# Patient Record
Sex: Female | Born: 1990 | Race: Black or African American | Hispanic: No | Marital: Single | Smoking: Never smoker
Health system: Southern US, Community
[De-identification: ages and names within clinical notes are randomized; demographics above are authoritative.]

## PROBLEM LIST (undated history)

## (undated) DIAGNOSIS — J45909 Unspecified asthma, uncomplicated: Secondary | ICD-10-CM

---

## 2015-05-22 ENCOUNTER — Emergency Department (HOSPITAL_COMMUNITY)
Admission: EM | Admit: 2015-05-22 | Discharge: 2015-05-22 | Disposition: A | Discharge status: Home / Self Care | Payer: BLUE CROSS/BLUE SHIELD | Attending: Physician Assistant | Admitting: Physician Assistant

## 2015-05-22 ENCOUNTER — Emergency Department (HOSPITAL_COMMUNITY): Payer: BLUE CROSS/BLUE SHIELD

## 2015-05-22 ENCOUNTER — Encounter (HOSPITAL_COMMUNITY): Payer: Self-pay | Admitting: Nurse Practitioner

## 2015-05-22 DIAGNOSIS — R0602 Shortness of breath: Secondary | ICD-10-CM | POA: Diagnosis present

## 2015-05-22 DIAGNOSIS — Z3202 Encounter for pregnancy test, result negative: Secondary | ICD-10-CM | POA: Diagnosis not present

## 2015-05-22 DIAGNOSIS — J159 Unspecified bacterial pneumonia: Secondary | ICD-10-CM | POA: Diagnosis not present

## 2015-05-22 DIAGNOSIS — J189 Pneumonia, unspecified organism: Secondary | ICD-10-CM

## 2015-05-22 LAB — CBC
HCT: 36.4 % (ref 36.0–46.0)
Hemoglobin: 12.3 g/dL (ref 12.0–15.0)
MCH: 27.9 pg (ref 26.0–34.0)
MCHC: 33.8 g/dL (ref 30.0–36.0)
MCV: 82.5 fL (ref 78.0–100.0)
PLATELETS: 259 10*3/uL (ref 150–400)
RBC: 4.41 MIL/uL (ref 3.87–5.11)
RDW: 15.3 % (ref 11.5–15.5)
WBC: 6.9 10*3/uL (ref 4.0–10.5)

## 2015-05-22 LAB — COMPREHENSIVE METABOLIC PANEL
ALK PHOS: 54 U/L (ref 38–126)
ALT: 16 U/L (ref 14–54)
AST: 16 U/L (ref 15–41)
Albumin: 3.9 g/dL (ref 3.5–5.0)
Anion gap: 12 (ref 5–15)
BUN: 7 mg/dL (ref 6–20)
CHLORIDE: 104 mmol/L (ref 101–111)
CO2: 24 mmol/L (ref 22–32)
CREATININE: 0.64 mg/dL (ref 0.44–1.00)
Calcium: 9.8 mg/dL (ref 8.9–10.3)
GFR calc Af Amer: >60 mL/min (ref >60)
Glucose, Bld: 123 mg/dL — ABNORMAL HIGH (ref 65–99)
Potassium: 3.7 mmol/L (ref 3.5–5.1)
Sodium: 140 mmol/L (ref 135–145)
Total Bilirubin: <0.1 mg/dL — ABNORMAL LOW (ref 0.3–1.2)
Total Protein: 7 g/dL (ref 6.5–8.1)

## 2015-05-22 LAB — URINALYSIS, ROUTINE W REFLEX MICROSCOPIC
BILIRUBIN URINE: NEGATIVE
GLUCOSE, UA: NEGATIVE mg/dL
KETONES UR: NEGATIVE mg/dL
Nitrite: NEGATIVE
PROTEIN: NEGATIVE mg/dL
Specific Gravity, Urine: 1.02 (ref 1.005–1.030)
pH: 7 (ref 5.0–8.0)

## 2015-05-22 LAB — I-STAT BETA HCG BLOOD, ED (MC, WL, AP ONLY): I-stat hCG, quantitative: <5 m[IU]/mL (ref <5)

## 2015-05-22 LAB — URINE MICROSCOPIC-ADD ON: WBC, UA: NONE SEEN WBC/hpf (ref 0–5)

## 2015-05-22 LAB — I-STAT CG4 LACTIC ACID, ED: Lactic Acid, Venous: 1.3 mmol/L (ref 0.5–2.0)

## 2015-05-22 MED ORDER — GUAIFENESIN-CODEINE 100-10 MG/5ML PO SOLN: 5.0000 mL | Freq: Four times a day (QID) | ORAL | Status: DC | PRN

## 2015-05-22 MED ORDER — BENZONATATE 100 MG PO CAPS: 100.0000 mg | ORAL_CAPSULE | Freq: Three times a day (TID) | ORAL | Status: DC | PRN

## 2015-05-22 MED ORDER — AZITHROMYCIN 250 MG PO TABS
500.0000 mg | ORAL_TABLET | Freq: Once | ORAL | Status: AC
  Administered 2015-05-22: 500 mg via ORAL
  Filled 2015-05-22: qty 2

## 2015-05-22 MED ORDER — AZITHROMYCIN 250 MG PO TABS: 250.0000 mg | ORAL_TABLET | Freq: Once | ORAL | Status: DC

## 2015-05-22 NOTE — ED Provider Notes (Signed)
CSN: [id number]     Arrival date & time 05/22/15  1748 History   First MD Initiated Contact with Patient 05/22/15 2209     Chief Complaint  Patient presents with  . 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 Patient has had a couple days of feeling fatigued, mild fevers, cough, congestion.  Patient says that she's had multiple students present with similar complaints and be diagnosed with flu.  Patient able to take by mouth without issue.  Reports cough during the day  helped by cough syrup.   History reviewed. No pertinent past medical history. History reviewed. No pertinent past surgical history. History reviewed. No pertinent family history. Social History  Substance Use Topics  . Smoking status: Never Smoker   . Smokeless tobacco: None  . Alcohol Use: Yes   OB History    No data available     Review of Systems  Constitutional: Negative for activity change.  HENT: Negative for congestion.   Respiratory: Positive for shortness of breath.   Cardiovascular: Negative for chest pain.  Gastrointestinal: Negative for abdominal pain.  Genitourinary: Negative for dysuria.  Neurological: Negative for dizziness.  Psychiatric/Behavioral: Negative for confusion.      Allergies  Ortho tri-cyclen  Home Medications   Prior to Admission medications   Not on File   BP 127/88 mmHg  Pulse 76  Temp(Src) 98.2 F (36.8 C) (Oral)  Resp 18  SpO2 100%  LMP 05/15/2015 Physical Exam  Constitutional: She is oriented to person, place, and time. She appears well-developed and well-nourished.  HENT:  Head: Normocephalic and atraumatic.  Eyes: Conjunctivae are normal. Right eye exhibits no discharge.  Neck: Neck supple.  Cardiovascular: Normal rate, regular rhythm and normal heart sounds.   No murmur heard. Pulmonary/Chest:  Effort normal and breath sounds normal. She has no wheezes. She has no rales.  Abdominal: Soft. She exhibits no distension. There is no tenderness.  Musculoskeletal: Normal range of motion. She exhibits no edema.  Neurological: She is oriented to person, place, and time. No cranial nerve deficit.  Skin: Skin is warm and dry. No rash noted. She is not diaphoretic.  Psychiatric: She has a normal mood and affect. Her behavior is normal.  Nursing note and vitals reviewed.   ED Course  Procedures (including critical care time) Labs Review Labs Reviewed  COMPREHENSIVE METABOLIC PANEL - Abnormal; Notable for the following:    Glucose, Bld 123 (*)    Total Bilirubin <0.1 (*)    All other components within normal limits  URINALYSIS, ROUTINE W REFLEX MICROSCOPIC (NOT AT [facility]) - Abnormal; Notable for the following:    APPearance TURBID (*)    Hgb urine dipstick SMALL (*)    Leukocytes, UA TRACE (*)    All other components within normal limits  URINE MICROSCOPIC-ADD ON - Abnormal; Notable for the following:    Squamous Epithelial / LPF 0-5 (*)    Bacteria, UA RARE (*)    All other components within normal limits  CBC  I-STAT BETA HCG BLOOD, ED ([facility], [facility], [facility] ONLY)  I-STAT CG4 LACTIC ACID, ED    Imaging Review Dg Chest 2 View  05/22/2015  CLINICAL DATA:  Worsening productive cough over the last 4 days. EXAM: CHEST  2 VIEW COMPARISON:  None. FINDINGS: Heart size is normal. Mediastinal shadows are normal. The right lung is clear. There is patchy  infiltrate at the left base medially. No effusions. No bony abnormalities. IMPRESSION: Small area of pneumonia left lower lobe posterior medially. Electronically Signed   By: Paulina Fusi M.D.   On: 05/22/2015 19:23   I have personally reviewed and evaluated these images and lab results as part of my medical decision-making.   EKG Interpretation None      MDM   Final diagnoses:  None    Patient is a 25 year old female presenting with cough  congestion and flulike symptoms for the last couple days. Patient has multiple sick contacts at school. Chest x-ray shows pneumonia. We'll treat for community acquired pneumonia. Patient has no wheezing on exam. We'll give her azithromycin as well as Tessalon Perles and cough syrup with codeine for evenings. Patient taking by mouth, normal physical exam and normal vital signs at time of discharge.   Liah Morr Randall An, MD 05/22/15 [phone]

## 2015-05-22 NOTE — Discharge Instructions (Signed)
Please return as needed with any concerns.  Community-Acquired Pneumonia, Adult Pneumonia is an infection of the lungs. One type of pneumonia can happen while a person is in a hospital. A different type can happen when a person is not in a hospital (community-acquired pneumonia). It is easy for this kind to spread from person to person. It can spread to you if you breathe near an infected person who coughs or sneezes. Some symptoms include:  A dry cough.  A wet (productive) cough.  Fever.  Sweating.  Chest pain. HOME CARE  Take over-the-counter and prescription medicines only as told by your doctor.  Only take cough medicine if you are losing sleep.  If you were prescribed an antibiotic medicine, take it as told by your doctor. Do not stop taking the antibiotic even if you start to feel better.  Sleep with your head and neck raised (elevated). You can do this by putting a few pillows under your head, or you can sleep in a recliner.  Do not use tobacco products. These include cigarettes, chewing tobacco, and e-cigarettes. If you need help quitting, ask your doctor.  Drink enough water to keep your pee (urine) clear or pale yellow. A shot (vaccine) can help prevent pneumonia. Shots are often suggested for:  People older than 26 years of age.  People older than 26 years of age:  Who are having cancer treatment.  Who have long-term (chronic) lung disease.  Who have problems with their body's defense system (immune system). You may also prevent pneumonia if you take these actions:  Get the flu (influenza) shot every year.  Go to the dentist as often as told.  Wash your hands often. If soap and water are not available, use hand sanitizer. GET HELP IF:  You have a fever.  You lose sleep because your cough medicine does not help. GET HELP RIGHT AWAY IF:  You are short of breath and it gets worse.  You have more chest pain.  Your sickness gets worse. This is very  serious if:  You are an older adult.  Your body's defense system is weak.  You cough up blood.   This information is not intended to replace advice given to you by your health care provider. Make sure you discuss any questions you have with your health care provider.   Document Released: 09/05/2007 Document Revised: 12/08/2014 Document Reviewed: 07/14/2014 Elsevier Interactive Patient Education [employer]

## 2015-05-22 NOTE — ED Notes (Signed)
She c/o 3 day history of cough, body aches, nausea, diarrhea. She has been around multiple people with flu symptoms and is concerned for the flu. She is A&Ox4, resp e/u

## 2017-04-14 ENCOUNTER — Ambulatory Visit (INDEPENDENT_AMBULATORY_CARE_PROVIDER_SITE_OTHER): Payer: BC Managed Care – PPO

## 2017-04-14 ENCOUNTER — Other Ambulatory Visit: Payer: Self-pay

## 2017-04-14 ENCOUNTER — Ambulatory Visit (HOSPITAL_COMMUNITY)
Admission: EM | Admit: 2017-04-14 | Discharge: 2017-04-14 | Disposition: A | Discharge status: Home / Self Care | Payer: BC Managed Care – PPO | Attending: Internal Medicine | Admitting: Internal Medicine

## 2017-04-14 ENCOUNTER — Encounter (HOSPITAL_COMMUNITY): Payer: Self-pay | Admitting: *Deleted

## 2017-04-14 DIAGNOSIS — K5900 Constipation, unspecified: Secondary | ICD-10-CM

## 2017-04-14 DIAGNOSIS — R110 Nausea: Secondary | ICD-10-CM

## 2017-04-14 DIAGNOSIS — Z3202 Encounter for pregnancy test, result negative: Secondary | ICD-10-CM | POA: Diagnosis not present

## 2017-04-14 LAB — POCT PREGNANCY, URINE: PREG TEST UR: NEGATIVE

## 2017-04-14 LAB — POCT URINALYSIS DIP (DEVICE)
Bilirubin Urine: NEGATIVE
Glucose, UA: NEGATIVE mg/dL
KETONES UR: 15 mg/dL — AB
Leukocytes, UA: NEGATIVE
Nitrite: NEGATIVE
Protein, ur: NEGATIVE mg/dL
Specific Gravity, Urine: 1.025 (ref 1.005–1.030)
Urobilinogen, UA: 0.2 mg/dL (ref 0.0–1.0)
pH: 6.5 (ref 5.0–8.0)

## 2017-04-14 MED ORDER — MINERAL OIL RE ENEM: 1.0000 | ENEMA | Freq: Once | RECTAL | 0 refills | Status: AC

## 2017-04-14 MED ORDER — POLYETHYLENE GLYCOL 3350 17 GM/SCOOP PO POWD: 17.0000 g | Freq: Every day | ORAL | 0 refills | Status: DC

## 2017-04-14 NOTE — Discharge Instructions (Addendum)
Urine test was notable only for blood today, consistent with your period just ending.  Pregnancy test was negative.  X-ray showed moderate stool.  Prescriptions for MiraLAX and for oil enema were sent to the pharmacy.  Use the oil enema today and try to retain for a couple of hours, to lubricate stool.  Put a spoon full of MiraLAX in a glass of liquid and drink 2 or 3 times today, and then adjust dose as needed to achieve one soft stool daily, until you have been feeling better for a week or so.  Eating an apple every day may be very helpful.  Taking fiber is also helpful, either may increase bloating and gassiness temporarily.  Recheck or follow-up with your primary care provider if severe/sustained (more than an hour) abdominal pain or vomiting occur, or if bowel movements are not improving over the next few days.

## 2017-04-14 NOTE — ED Triage Notes (Signed)
C/O constipation x 4 days.  Last BM 4 days ago and was normal.  States she strained 2 days ago and had few small pellets.  No c/o full rectum, back pain, nausea.  Having difficulty sitting due to pain.  Has taken one dose of stool softener (no laxatives).

## 2017-04-14 NOTE — ED Provider Notes (Signed)
[facility]    CSN: [id number] Arrival date & time: 04/14/17  1302     History   Chief Complaint Chief Complaint  Patient presents with  . Constipation  . Nausea    HPI Sandra Esparza is a 27 y.o. female.   She presents today with 5 days difficulty having a bowel movement.  She has been having to strain, usually has 1-2 bowel movements a day, now just pellets and a lot of spasm/cramping.  No dysuria, no urinary frequency, but has been avoiding urinating, because it sets off the bowel spasms.  Just had a normal menstrual period, except may be a little less cramping than usual.  No unusual vaginal discharge or bleeding at present.  Uses condoms to prevent pregnancy.  Little bit of nausea, poor appetite, but not vomiting.  No fever.    HPI  History reviewed. No pertinent past medical history.  History reviewed. No pertinent surgical history.   Home Medications    Prior to Admission medications   Medication Sig Start Date End Date Taking? Authorizing Provider  polyethylene glycol powder (GLYCOLAX/MIRALAX) powder Take 17 g by mouth daily for 15 doses. Put in beverage and drink.  Adjust dose up or down to achieve 1 soft stool daily. 04/14/17 04/29/17  Isa RankinMurray, Laura Wilson, MD    Family History No family history on file.  Social History Social History   Tobacco Use  . Smoking status: Never Smoker  . Smokeless tobacco: Never Used  Substance Use Topics  . Alcohol use: Yes    Comment: occasionally  . Drug use: No     Allergies   Ortho tri-cyclen [norgestimate-eth estradiol] and Latex   Review of Systems Review of Systems  All other systems reviewed and are negative.    Physical Exam Triage Vital Signs ED Triage Vitals  Enc Vitals Group     BP 04/14/17 1332 135/83     Pulse Rate 04/14/17 1332 83     Resp 04/14/17 1332 16     Temp 04/14/17 1332 98.9 F (37.2 C)     Temp Source 04/14/17 1332 Oral     SpO2 04/14/17 1332 97 %     Weight --    Height --      Pain Score 04/14/17 1333 8     Pain Loc --    Updated Vital Signs BP 135/83   Pulse 83   Temp 98.9 F (37.2 C) (Oral)   Resp 16   LMP 04/09/2017 (Exact Date)   SpO2 97%  Physical Exam  Constitutional: She is oriented to person, place, and time. No distress.  HENT:  Head: Atraumatic.  Eyes:  Conjugate gaze observed, no eye redness/discharge  Neck: Neck supple.  Cardiovascular: Normal rate and regular rhythm.  Pulmonary/Chest: No respiratory distress. She has no wheezes. She has no rales.  Lungs clear, symmetric breath sounds Diminished in both bases  Abdominal: Soft. There is no tenderness. There is no rebound and no guarding.  Little bit distended, but still soft Some increase in abdominal discomfort if laying on her left side  Musculoskeletal: Normal range of motion.  Neurological: She is alert and oriented to person, place, and time.  Skin: Skin is warm and dry.  Nursing note and vitals reviewed.    UC Treatments / Results  Labs Results for orders placed or performed during the hospital encounter of 04/14/17  POCT urinalysis dip (device)  Result Value Ref Range   Glucose, UA NEGATIVE NEGATIVE mg/dL  Bilirubin Urine NEGATIVE NEGATIVE   Ketones, ur 15 (A) NEGATIVE mg/dL   Specific Gravity, Urine 1.025 1.005 - 1.030   Hgb urine dipstick MODERATE (A) NEGATIVE   pH 6.5 5.0 - 8.0   Protein, ur NEGATIVE NEGATIVE mg/dL   Urobilinogen, UA 0.2 0.0 - 1.0 mg/dL   Nitrite NEGATIVE NEGATIVE   Leukocytes, UA NEGATIVE NEGATIVE  Pregnancy, urine POC  Result Value Ref Range   Preg Test, Ur NEGATIVE NEGATIVE    Radiology Dg Abd Acute W/chest  Result Date: 04/14/2017 CLINICAL DATA:  Constipation, abdominal pain EXAM: DG ABDOMEN ACUTE W/ 1V CHEST COMPARISON:  05/22/2015 FINDINGS: Moderate stool burden throughout the colon. The bowel gas pattern is normal. There is no evidence of free intraperitoneal air. No suspicious radio-opaque calculi or other significant  radiographic abnormality is seen. Heart size and mediastinal contours are within normal limits. Both lungs are clear. IMPRESSION: Moderate stool burden.  No acute findings. Electronically Signed   By: Charlett Nose M.D.   On: 04/14/2017 14:39    Procedures Procedures (including critical care time) None today  Final Clinical Impressions(s) / UC Diagnoses   Final diagnoses:  Constipation, unspecified constipation type   Urine test was notable only for blood today, consistent with your period just ending.  Pregnancy test was negative.  X-ray showed moderate stool.  Prescriptions for MiraLAX and for oil enema were sent to the pharmacy.  Use the oil enema today and try to retain for a couple of hours, to lubricate stool.  Put a spoon full of MiraLAX in a glass of liquid and drink 2 or 3 times today, and then adjust dose as needed to achieve one soft stool daily, until you have been feeling better for a week or so.  Eating an apple every day may be very helpful.  Taking fiber is also helpful, either may increase bloating and gassiness temporarily.  Recheck or follow-up with your primary care provider if severe/sustained (more than an hour) abdominal pain or vomiting occur, or if bowel movements are not improving over the next few days.  ED Discharge Orders        Ordered    polyethylene glycol powder (GLYCOLAX/MIRALAX) powder  Daily     04/14/17 1451    mineral oil enema   Once     04/14/17 1451          Isa Rankin, MD 04/15/17 1004

## 2017-04-29 ENCOUNTER — Ambulatory Visit (INDEPENDENT_AMBULATORY_CARE_PROVIDER_SITE_OTHER): Payer: BC Managed Care – PPO

## 2017-04-29 ENCOUNTER — Encounter (HOSPITAL_COMMUNITY): Payer: Self-pay | Admitting: Emergency Medicine

## 2017-04-29 ENCOUNTER — Ambulatory Visit (HOSPITAL_COMMUNITY)
Admission: EM | Admit: 2017-04-29 | Discharge: 2017-04-29 | Disposition: A | Discharge status: Home / Self Care | Payer: BC Managed Care – PPO | Attending: Family Medicine | Admitting: Family Medicine

## 2017-04-29 DIAGNOSIS — B349 Viral infection, unspecified: Secondary | ICD-10-CM | POA: Diagnosis not present

## 2017-04-29 DIAGNOSIS — R05 Cough: Secondary | ICD-10-CM

## 2017-04-29 DIAGNOSIS — R0789 Other chest pain: Secondary | ICD-10-CM | POA: Diagnosis not present

## 2017-04-29 MED ORDER — CETIRIZINE-PSEUDOEPHEDRINE ER 5-120 MG PO TB12: 1.0000 | ORAL_TABLET | Freq: Every day | ORAL | 0 refills | Status: DC

## 2017-04-29 MED ORDER — BENZONATATE 100 MG PO CAPS: 100.0000 mg | ORAL_CAPSULE | Freq: Three times a day (TID) | ORAL | 0 refills | Status: DC

## 2017-04-29 MED ORDER — IPRATROPIUM BROMIDE 0.06 % NA SOLN: 2.0000 | Freq: Four times a day (QID) | NASAL | 0 refills | Status: DC

## 2017-04-29 NOTE — ED Triage Notes (Signed)
PT reports sore throat last Wednesday. This resolved.  PT reports congestion, chest pain, and cough that started Saturday.

## 2017-04-29 NOTE — ED Provider Notes (Signed)
[facility]    CSN: [id number] Arrival date & time: 04/29/17  1251     History   Chief Complaint Chief Complaint  Patient presents with  . URI    HPI Ina KickBarbara Brophy is a 27 y.o. female.   27 year old female comes in for 3-day history of URI symptoms.  She has had productive cough, rhinorrhea, nasal congestion, chest pain with breathing/coughing. otc cough drops, robitussin, flonase with some relief. Denies fevers, chills, night sweats. Denies shortness of breath, wheezing. Never smoker.   Patient states she has history of pneumonia with only symptoms being chest pain with coughing, and requesting chest x-ray.      History reviewed. No pertinent past medical history.  There are no active problems to display for this patient.   History reviewed. No pertinent surgical history.  OB History    No data available       Home Medications    Prior to Admission medications   Medication Sig Start Date End Date Taking? Authorizing Provider  Multiple Vitamin (MULTIVITAMIN WITH MINERALS) TABS tablet Take 1 tablet by mouth daily.   Yes [provider]  benzonatate (TESSALON) 100 MG capsule Take 1 capsule (100 mg total) by mouth every 8 (eight) hours. 04/29/17   Cathie HoopsYu, Rosaline Ezekiel V, PA-C  cetirizine-pseudoephedrine (ZYRTEC-D) 5-120 MG tablet Take 1 tablet by mouth daily. 04/29/17   Cathie HoopsYu, Molly Maselli V, PA-C  ipratropium (ATROVENT) 0.06 % nasal spray Place 2 sprays into both nostrils 4 (four) times daily. 04/29/17   Belinda FisherYu, Nikeisha Klutz V, PA-C    Family History No family history on file.  Social History Social History   Tobacco Use  . Smoking status: Never Smoker  . Smokeless tobacco: Never Used  Substance Use Topics  . Alcohol use: Yes    Comment: occasionally  . Drug use: No     Allergies   Ortho tri-cyclen [norgestimate-eth estradiol] and Latex   Review of Systems Review of Systems  Reason unable to perform ROS: See HPI as above.     Physical Exam Triage Vital Signs ED  Triage Vitals  Enc Vitals Group     BP 04/29/17 1348 115/81     Pulse Rate 04/29/17 1348 65     Resp 04/29/17 1348 16     Temp 04/29/17 1348 98.4 F (36.9 C)     Temp Source 04/29/17 1348 Oral     SpO2 04/29/17 1348 100 %     Weight 04/29/17 1349 197 lb (89.4 kg)     Height 04/29/17 1349 5\' 2"  (1.575 m)     Head Circumference --      Peak Flow --      Pain Score 04/29/17 1349 5     Pain Loc --      Pain Edu? --      Excl. in GC? --    No data found.  Updated Vital Signs BP 115/81   Pulse 65   Temp 98.4 F (36.9 C) (Oral)   Resp 16   Ht 5\' 2"  (1.575 m)   Wt 197 lb (89.4 kg)   LMP 04/09/2017 (Exact Date)   SpO2 100%   BMI 36.03 kg/m   Physical Exam  Constitutional: She is oriented to person, place, and time. She appears well-developed and well-nourished. No distress.  HENT:  Head: Normocephalic and atraumatic.  Right Ear: Tympanic membrane, external ear and ear canal normal. Tympanic membrane is not erythematous and not bulging.  Left Ear: External ear and ear canal normal.  Tympanic membrane is erythematous. Tympanic membrane is not bulging.  Nose: Mucosal edema and rhinorrhea present. Right sinus exhibits no maxillary sinus tenderness and no frontal sinus tenderness. Left sinus exhibits no maxillary sinus tenderness and no frontal sinus tenderness.  Mouth/Throat: Uvula is midline and mucous membranes are normal. Posterior oropharyngeal erythema present. Tonsils are 1+ on the right. Tonsils are 1+ on the left. No tonsillar exudate.  Eyes: Conjunctivae are normal. Pupils are equal, round, and reactive to light.  Neck: Normal range of motion. Neck supple.  Cardiovascular: Normal rate, regular rhythm and normal heart sounds. Exam reveals no gallop and no friction rub.  No murmur heard. Pulmonary/Chest: Effort normal and breath sounds normal. She has no decreased breath sounds. She has no wheezes. She has no rhonchi. She has no rales.  Lymphadenopathy:    She has no cervical  adenopathy.  Neurological: She is alert and oriented to person, place, and time.  Skin: Skin is warm and dry.  Psychiatric: She has a normal mood and affect. Her behavior is normal. Judgment normal.    UC Treatments / Results  Labs (all labs ordered are listed, but only abnormal results are displayed) Labs Reviewed - No data to display  EKG  EKG Interpretation None       Radiology Dg Chest 2 View  Result Date: 04/29/2017 CLINICAL DATA:  Congestion, chest pain central sternal area (described as sharp when breathing and coughing), and prod cough that started Saturday. Pt sts that she has a hx of asymptomatic pneumonia. Denies fever. No known heart or lung conditio.*comment was truncated* EXAM: CHEST  2 VIEW COMPARISON:  1139 FINDINGS: 18 Normal mediastinum and cardiac silhouette. Normal pulmonary vasculature. No evidence of effusion, infiltrate, or pneumothorax. No acute bony abnormality. IMPRESSION: Normal chest radiograph Electronically Signed   By: Genevive Bi M.D.   On: 04/29/2017 14:19    Procedures Procedures (including critical care time)  Medications Ordered in UC Medications - No data to display   Initial Impression / Assessment and Plan / UC Course  I have reviewed the triage vital signs and the nursing notes.  Pertinent labs & imaging results that were available during my care of the patient were reviewed by me and considered in my medical decision making (see chart for details).    Patient with lungs clear to auscultation bilaterally without adventitious lung sounds.  She is afebrile, O2 saturation at 100%.  Discussed with patient symptoms most likely due to viral illness.  Patient stating she has history of pneumonia without fever, and requesting chest x-ray.  Discussed risks and benefits, patient would like to proceed.  Will obtain chest x-ray.  Chest x-ray negative for pneumonia.  Symptomatic treatment discussed.  Return precautions given.  Final Clinical  Impressions(s) / UC Diagnoses   Final diagnoses:  Viral syndrome    ED Discharge Orders        Ordered    benzonatate (TESSALON) 100 MG capsule  Every 8 hours     04/29/17 1430    ipratropium (ATROVENT) 0.06 % nasal spray  4 times daily     04/29/17 1430    cetirizine-pseudoephedrine (ZYRTEC-D) 5-120 MG tablet  Daily     04/29/17 1430        Belinda Fisher, PA-C 04/29/17 1438

## 2017-04-29 NOTE — Discharge Instructions (Signed)
Chest x-ray negative for pneumonia. Tessalon for cough. Start atrovent nasal spray, zyrtec-D for nasal congestion. Continue flonase. You can use over the counter nasal saline rinse such as neti pot for nasal congestion. Keep hydrated, your urine should be clear to pale yellow in color. Tylenol/motrin for fever and pain. Monitor for any worsening of symptoms, chest pain, shortness of breath, wheezing, swelling of the throat, follow up for reevaluation.

## 2017-12-12 IMAGING — DX DG CHEST 2V
2 series · 2 of 2 positions shown · non-contrast
Comparison: None.

CLINICAL DATA: Worsening productive cough over the last 4 days.

EXAM:
CHEST  2 VIEW

[chest pa]
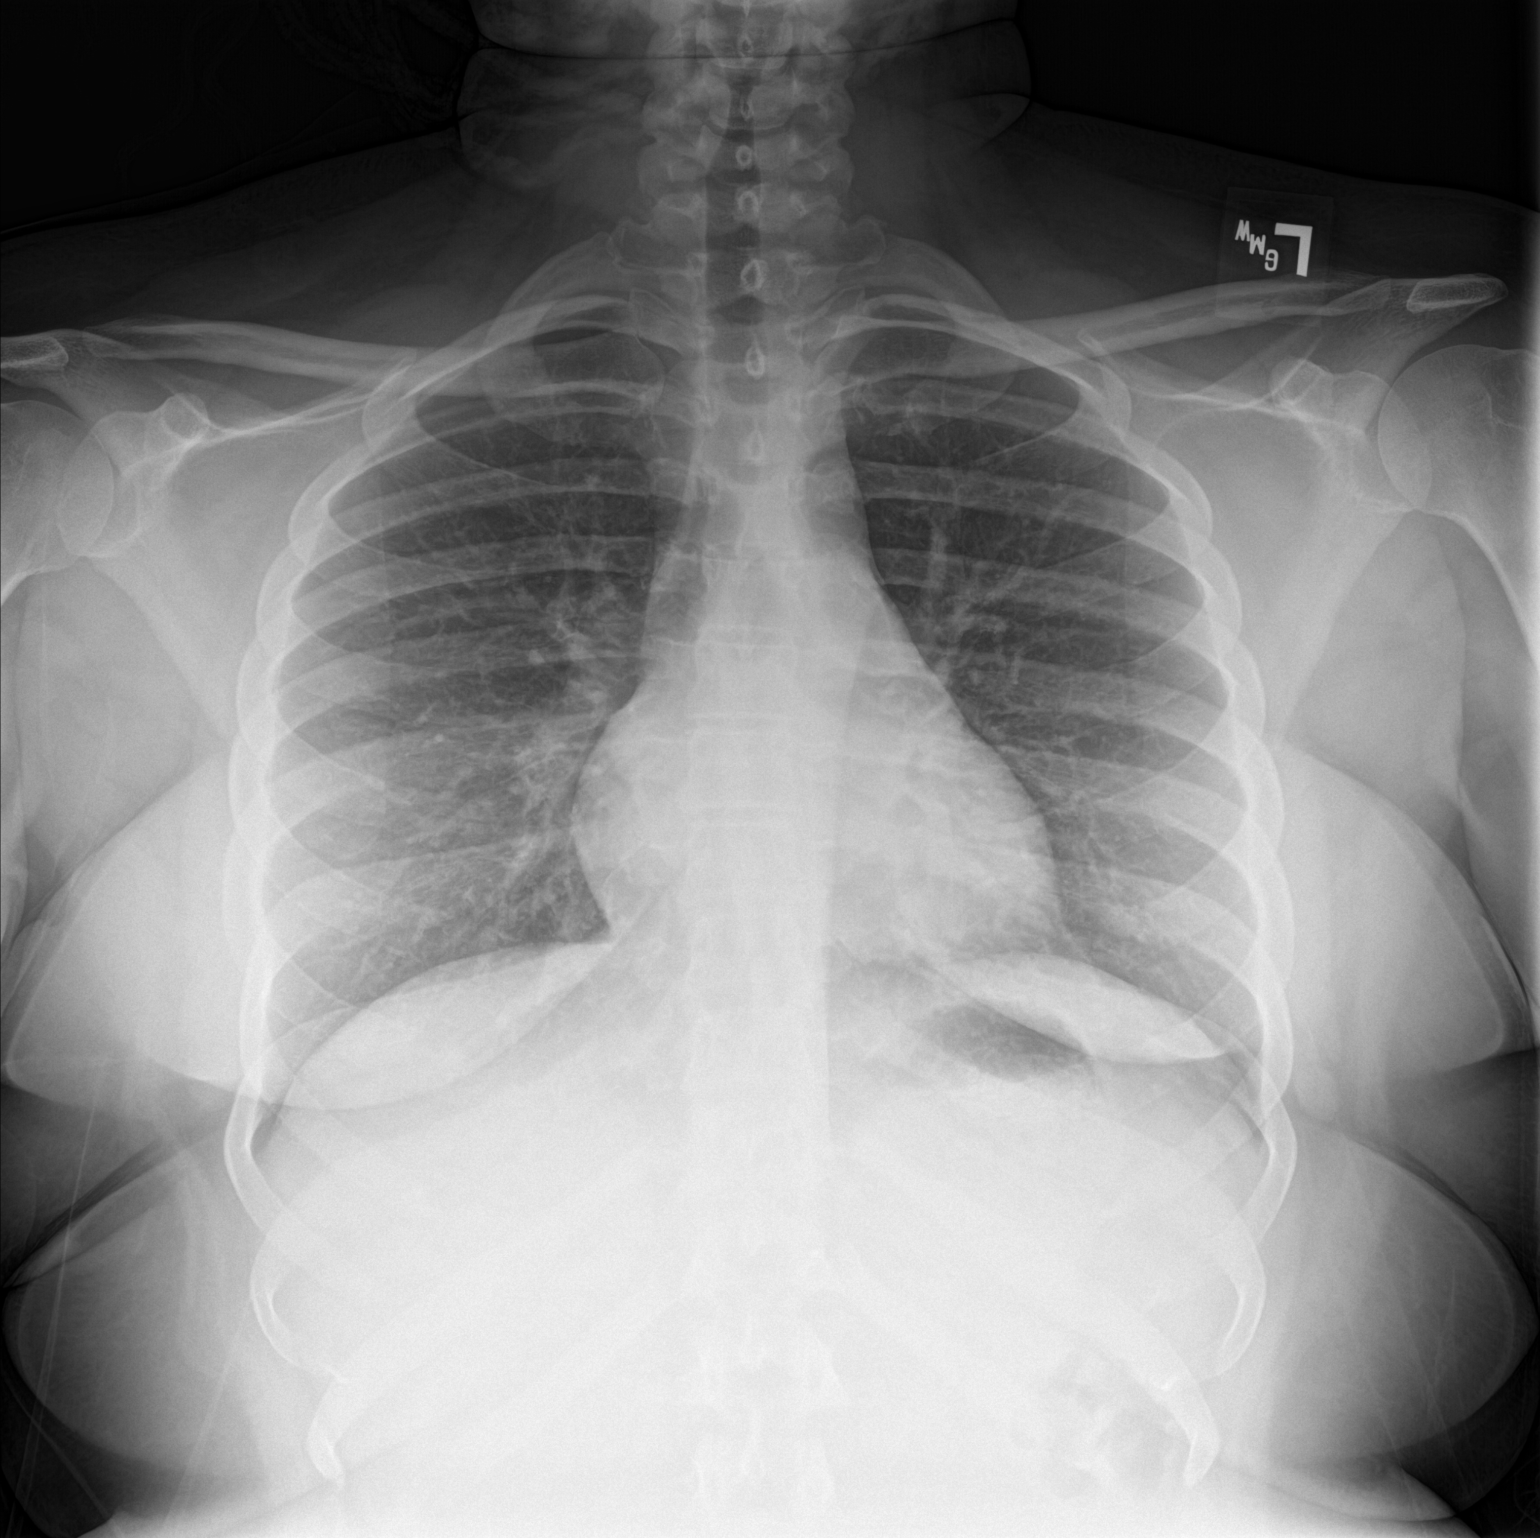

[chest lat]
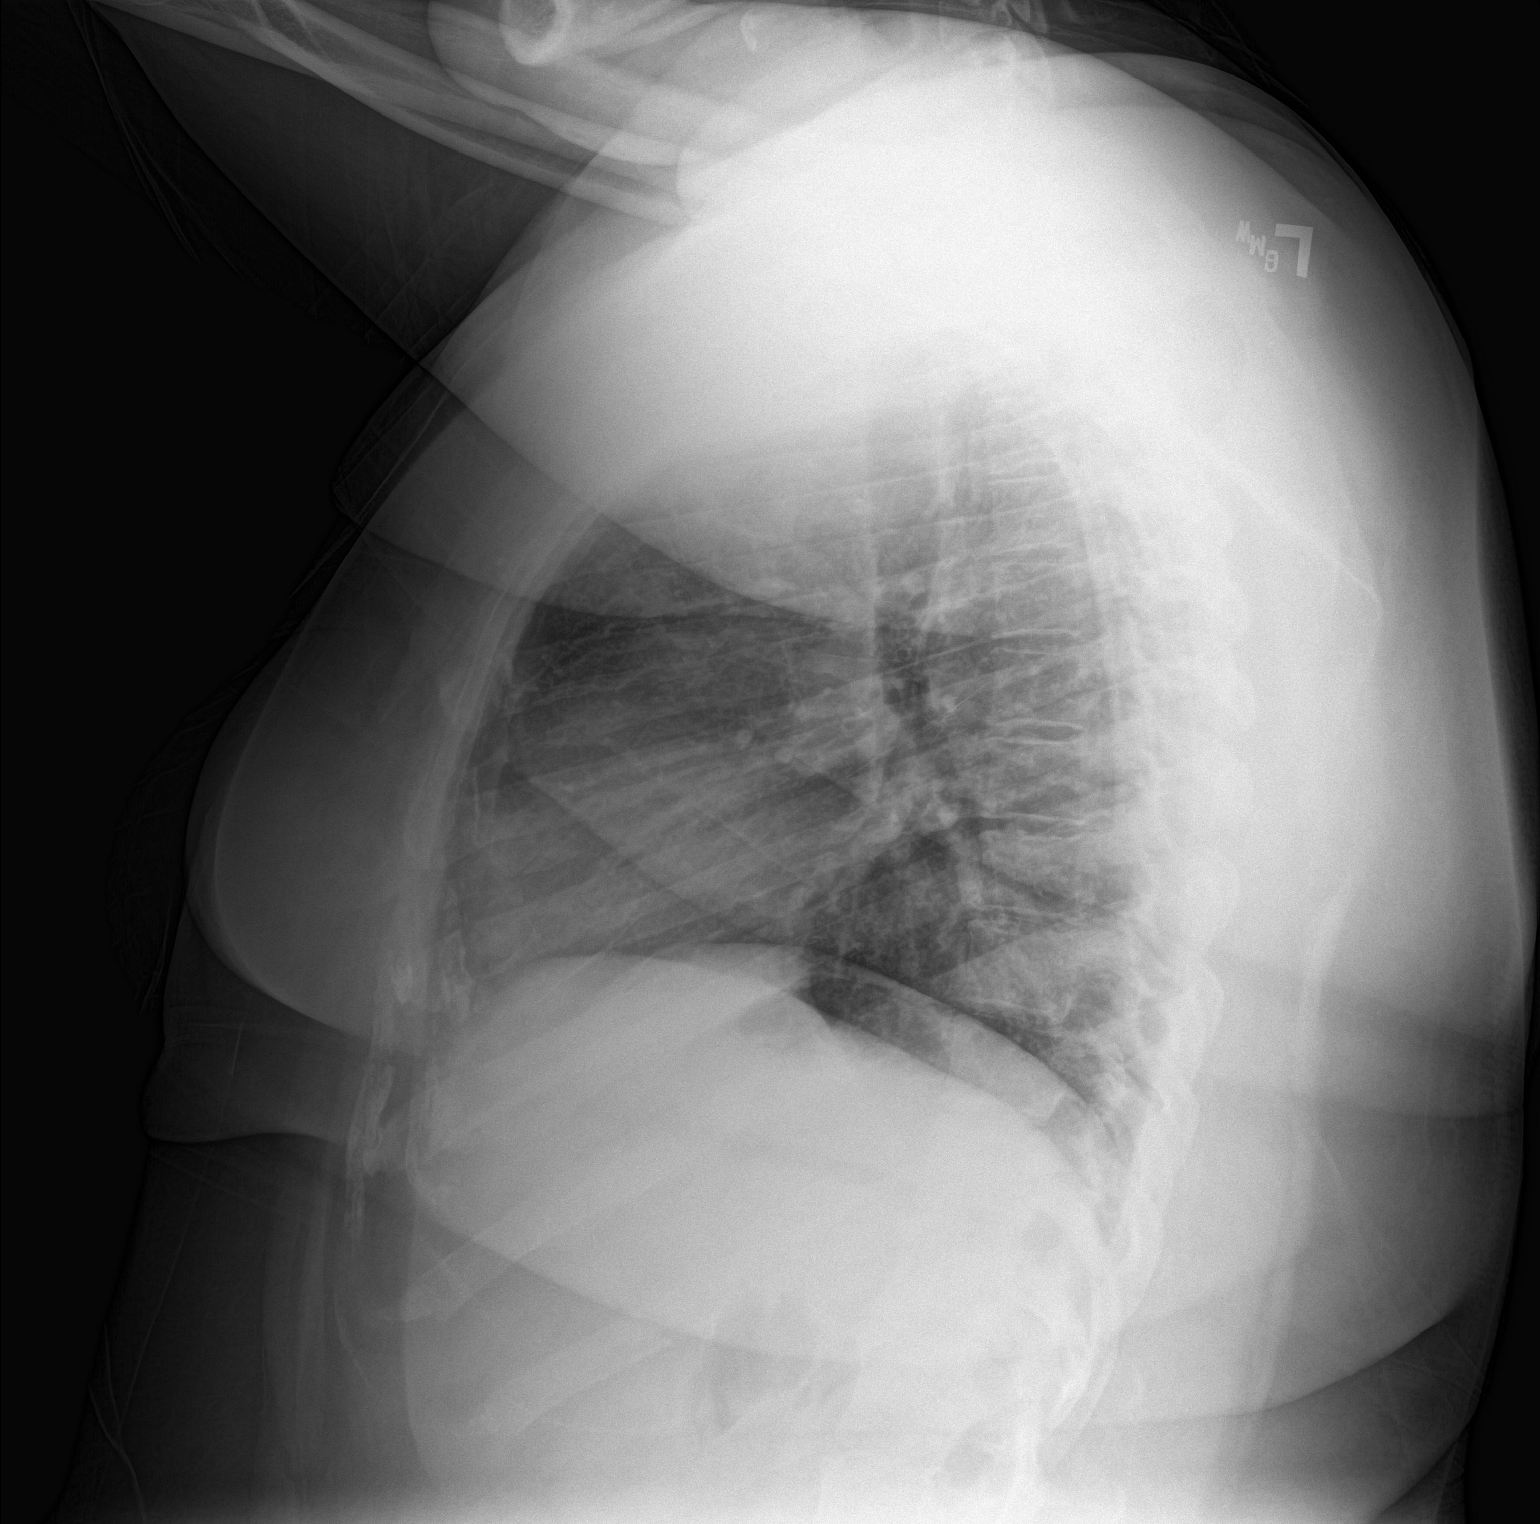

[2 of 2 positions shown; findings below may reference images not displayed]

FINDINGS: Heart size is normal. Mediastinal shadows are normal. The right lung
is clear. There is patchy infiltrate at the left base medially. No
effusions. No bony abnormalities.
IMPRESSION: Small area of pneumonia left lower lobe posterior medially.

## 2019-11-05 IMAGING — DX DG ABDOMEN ACUTE W/ 1V CHEST
3 series · 3 of 3 positions shown · non-contrast
Comparison: 05/22/2015

CLINICAL DATA: Constipation, abdominal pain

EXAM:
DG ABDOMEN ACUTE W/ 1V CHEST

[chest pa]
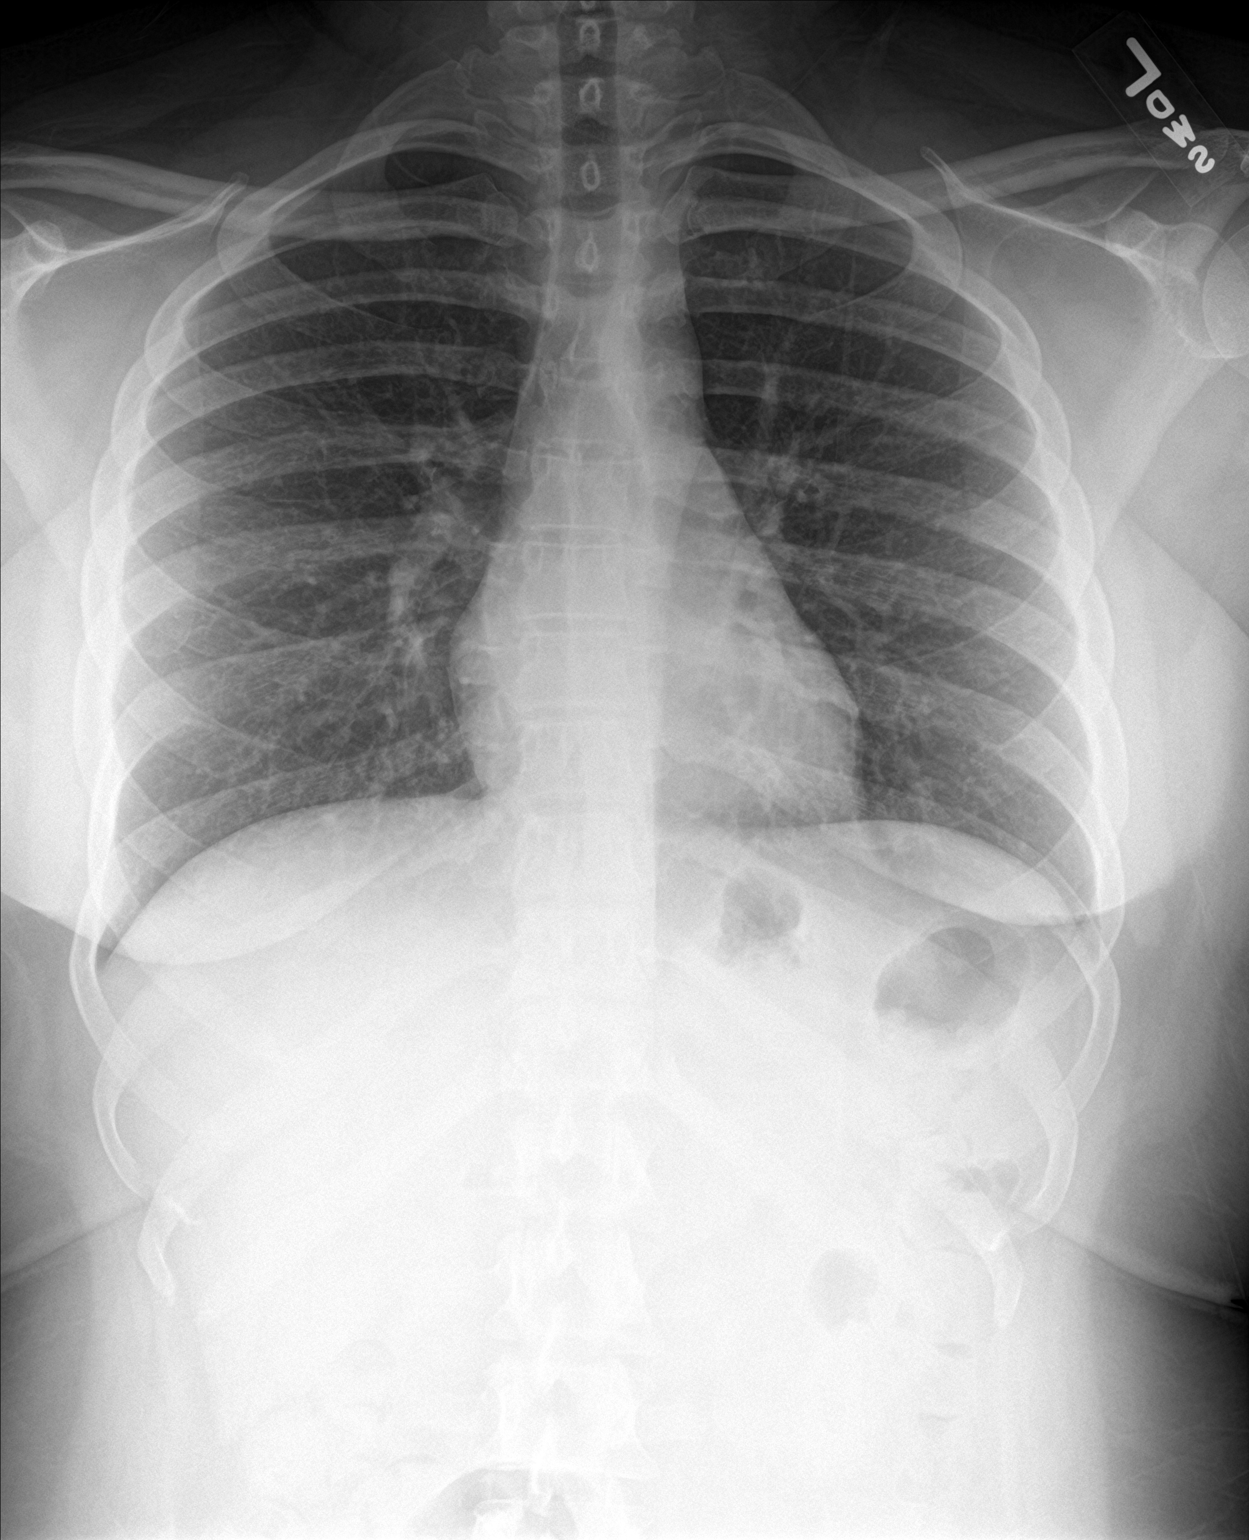

[abdomen erect]
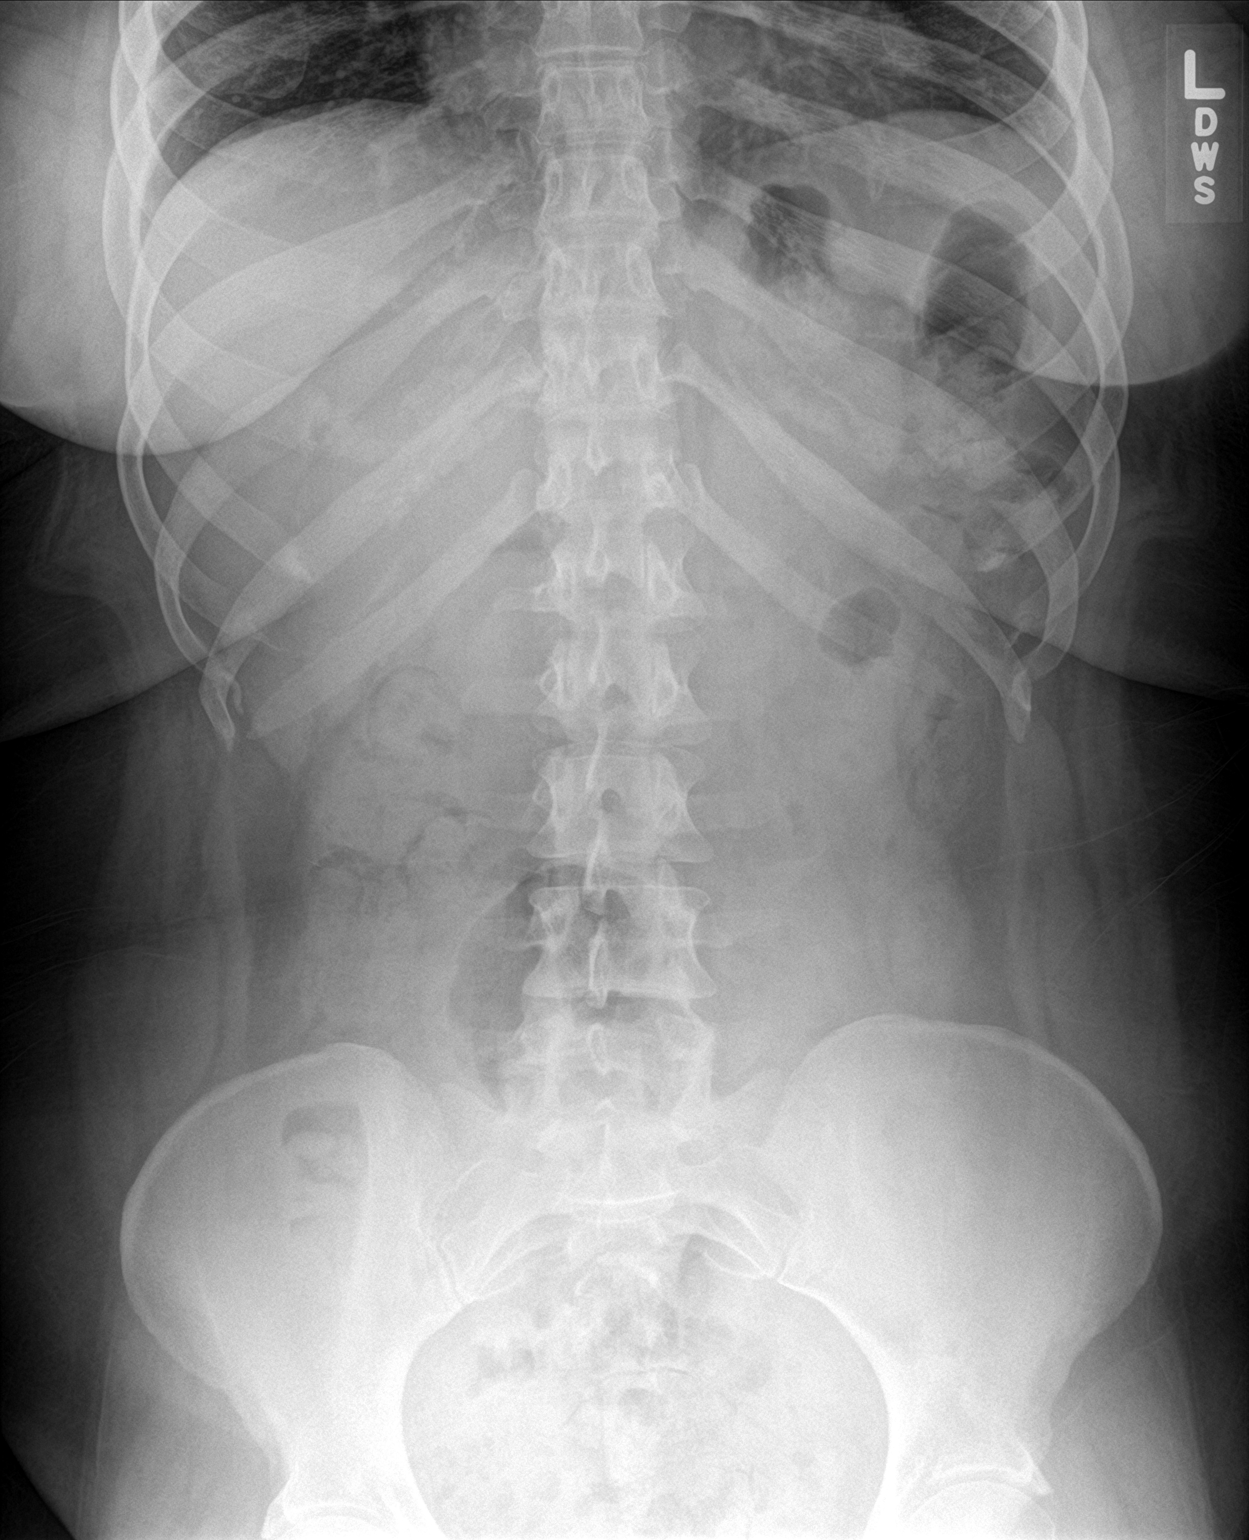

[abdomen supine]
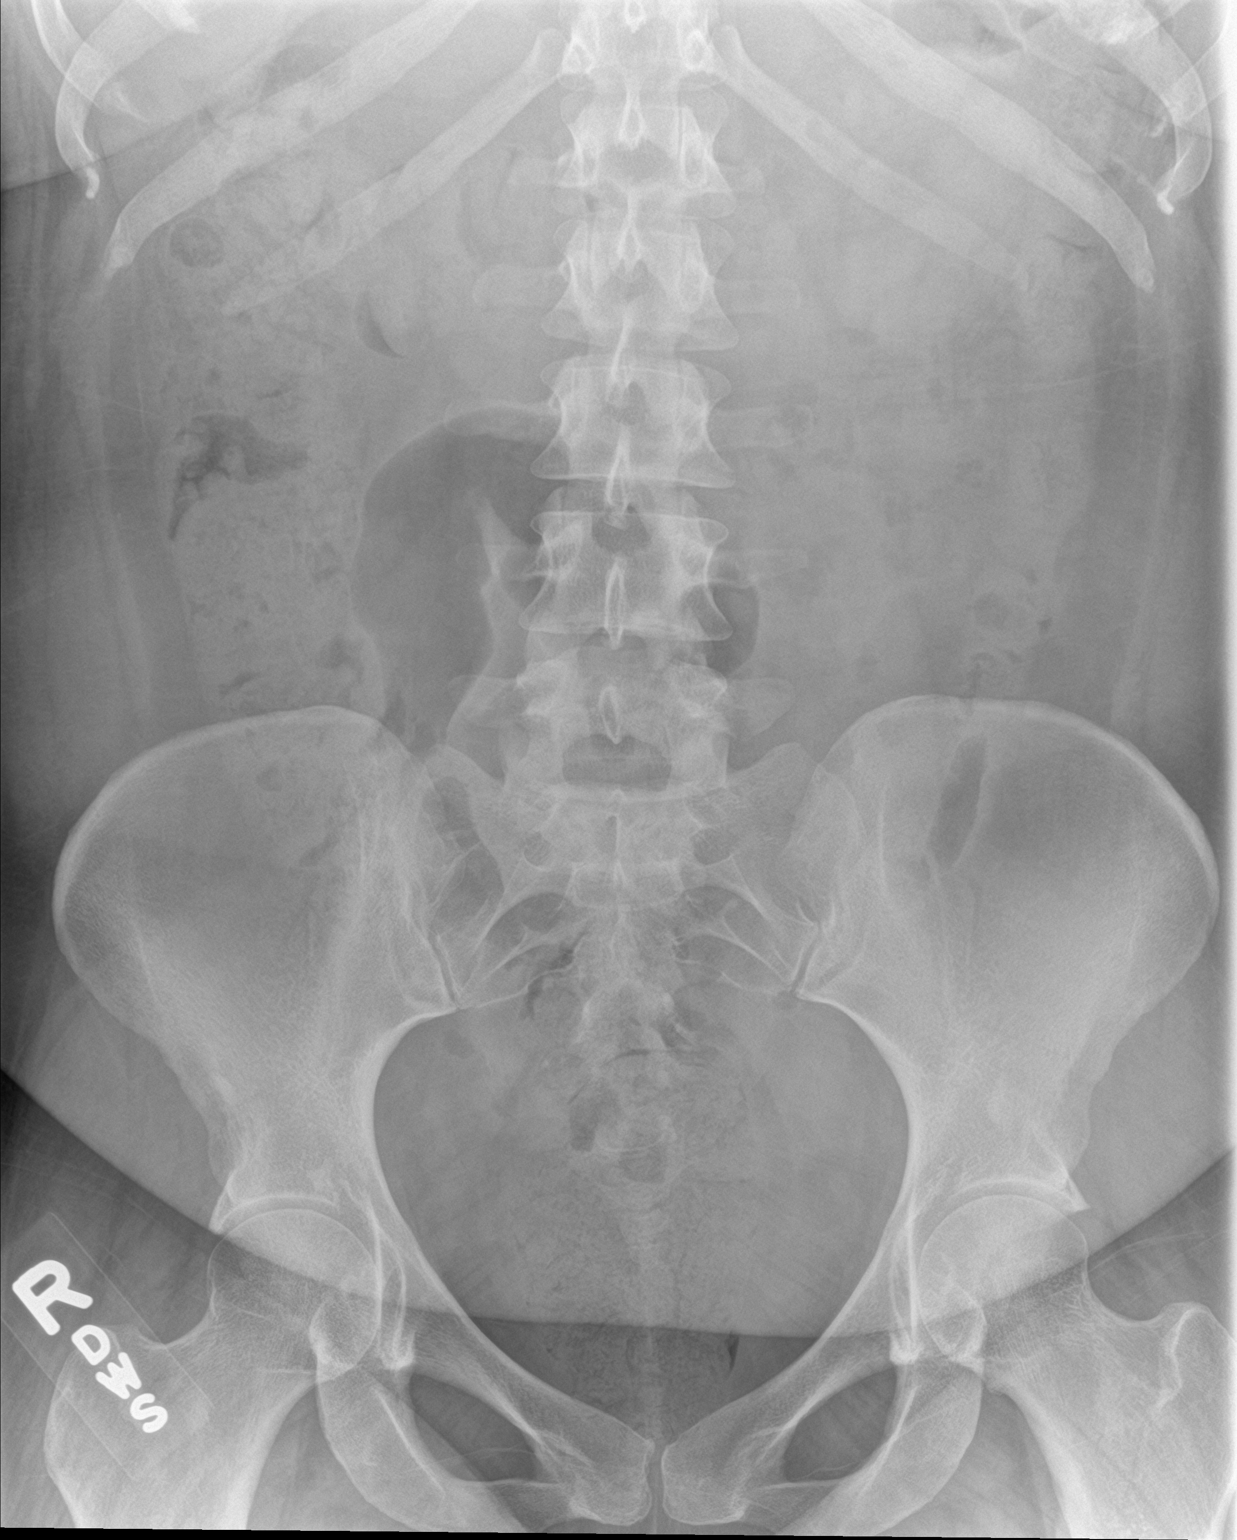

[3 of 3 positions shown; findings below may reference images not displayed]

FINDINGS: Moderate stool burden throughout the colon. The bowel gas pattern is
normal. There is no evidence of free intraperitoneal air. No
suspicious radio-opaque calculi or other significant radiographic
abnormality is seen. Heart size and mediastinal contours are within
normal limits. Both lungs are clear.
IMPRESSION: Moderate stool burden.  No acute findings.

## 2021-12-16 ENCOUNTER — Ambulatory Visit
Admission: RE | Admit: 2021-12-16 | Discharge: 2021-12-16 | Disposition: A | Discharge status: Home / Self Care | Payer: BC Managed Care – PPO | Source: Ambulatory Visit

## 2021-12-16 VITALS — BP 106/66 | HR 99 | Temp 98.6°F | Resp 18

## 2021-12-16 DIAGNOSIS — J4530 Mild persistent asthma, uncomplicated: Secondary | ICD-10-CM | POA: Diagnosis not present

## 2021-12-16 DIAGNOSIS — R058 Other specified cough: Secondary | ICD-10-CM

## 2021-12-16 DIAGNOSIS — J309 Allergic rhinitis, unspecified: Secondary | ICD-10-CM

## 2021-12-16 DIAGNOSIS — J018 Other acute sinusitis: Secondary | ICD-10-CM

## 2021-12-16 MED ORDER — ALBUTEROL SULFATE HFA 108 (90 BASE) MCG/ACT IN AERS: 1.0000 | INHALATION_SPRAY | Freq: Four times a day (QID) | RESPIRATORY_TRACT | 0 refills | Status: DC | PRN

## 2021-12-16 MED ORDER — PROMETHAZINE-DM 6.25-15 MG/5ML PO SYRP: 2.5000 mL | ORAL_SOLUTION | Freq: Three times a day (TID) | ORAL | 0 refills | Status: DC | PRN

## 2021-12-16 MED ORDER — CETIRIZINE HCL 10 MG PO TABS: 10.0000 mg | ORAL_TABLET | Freq: Every day | ORAL | 0 refills | Status: AC

## 2021-12-16 MED ORDER — PREDNISONE 20 MG PO TABS: ORAL_TABLET | ORAL | 0 refills | Status: DC

## 2021-12-16 MED ORDER — PSEUDOEPHEDRINE HCL 60 MG PO TABS: 60.0000 mg | ORAL_TABLET | Freq: Three times a day (TID) | ORAL | 0 refills | Status: DC | PRN

## 2021-12-16 NOTE — ED Triage Notes (Signed)
Pt here with cough for 10 days that has gotten a bit better, but still persistent despite OTC meds.

## 2021-12-16 NOTE — ED Provider Notes (Signed)
[facility]  Note:  This document was prepared using Conservation officer, historic buildings and may include unintentional dictation errors.  MRN: [id number] DOB: Jan 22, 1991  Subjective:   Sandra Esparza is a 31 y.o. female presenting for 10-day history of acute onset of persistent sinus congestion, postnasal drainage, sinus pressure, coughing and chest congestion.  Patient has a history of asthma but has not needed to use her albuterol inhaler as she does not have chest pain, shortness of breath or wheezing.  She does have a history of allergies and has been using allergy medications with very temporary relief of her sinuses.  She is not a smoker.  Would like an albuterol inhaler refill.  No current facility-administered medications for this encounter.  Current Outpatient Medications:    Semaglutide-Weight Management (WEGOVY) 2.4 MG/0.75ML SOAJ, Inject 2.4 mg into the skin once a week., Disp: , Rfl:    benzonatate (TESSALON) 100 MG capsule, Take 1 capsule (100 mg total) by mouth every 8 (eight) hours., Disp: 21 capsule, Rfl: 0   cetirizine-pseudoephedrine (ZYRTEC-D) 5-120 MG tablet, Take 1 tablet by mouth daily., Disp: 15 tablet, Rfl: 0   ipratropium (ATROVENT) 0.06 % nasal spray, Place 2 sprays into both nostrils 4 (four) times daily., Disp: 15 mL, Rfl: 0   Multiple Vitamin (MULTIVITAMIN WITH MINERALS) TABS tablet, Take 1 tablet by mouth daily., Disp: , Rfl:    Allergies  Allergen Reactions   Ortho Tri-Cyclen [Norgestimate-Eth Estradiol] Swelling   Latex Rash    History reviewed. No pertinent past medical history.   History reviewed. No pertinent surgical history.  History reviewed. No pertinent family history.  Social History   Tobacco Use   Smoking status: Never   Smokeless tobacco: Never  Vaping Use   Vaping Use: Never used  Substance Use Topics   Alcohol use: Yes    Comment: occasionally   Drug use: No    ROS   Objective:   Vitals: BP 106/66    Pulse 99   Temp 98.6 F (37 C)   Resp 18   SpO2 98%   Physical Exam Constitutional:      General: She is not in acute distress.    Appearance: Normal appearance. She is well-developed and normal weight. She is not ill-appearing, toxic-appearing or diaphoretic.  HENT:     Head: Normocephalic and atraumatic.     Right Ear: Tympanic membrane, ear canal and external ear normal. No drainage or tenderness. No middle ear effusion. There is no impacted cerumen. Tympanic membrane is not erythematous.     Left Ear: Tympanic membrane, ear canal and external ear normal. No drainage or tenderness.  No middle ear effusion. There is no impacted cerumen. Tympanic membrane is not erythematous.     Nose: Congestion present. No rhinorrhea.     Mouth/Throat:     Mouth: Mucous membranes are moist. No oral lesions.     Pharynx: No pharyngeal swelling, oropharyngeal exudate, posterior oropharyngeal erythema or uvula swelling.     Tonsils: No tonsillar exudate or tonsillar abscesses.  Eyes:     General: No scleral icterus.       Right eye: No discharge.        Left eye: No discharge.     Extraocular Movements: Extraocular movements intact.     Right eye: Normal extraocular motion.     Left eye: Normal extraocular motion.     Conjunctiva/sclera: Conjunctivae normal.  Cardiovascular:     Rate and Rhythm: Normal rate and regular  rhythm.     Heart sounds: Normal heart sounds. No murmur heard.    No friction rub. No gallop.  Pulmonary:     Effort: Pulmonary effort is normal. No respiratory distress.     Breath sounds: No stridor. No wheezing, rhonchi or rales.  Chest:     Chest wall: No tenderness.  Musculoskeletal:     Cervical back: Normal range of motion and neck supple.  Lymphadenopathy:     Cervical: No cervical adenopathy.  Skin:    General: Skin is warm and dry.  Neurological:     General: No focal deficit present.     Mental Status: She is alert and oriented to person, place, and time.   Psychiatric:        Mood and Affect: Mood normal.        Behavior: Behavior normal.     Assessment and Plan :   PDMP not reviewed this encounter.  1. Acute non-recurrent sinusitis of other sinus   2. Productive cough   3. Allergic rhinitis, unspecified seasonality, unspecified trigger   4. Mild persistent asthma without complication    Will start empiric treatment for sinusitis with Augmentin.  Recommended an oral prednisone course in the context of her allergic rhinitis and asthma.  Refilled her albuterol inhaler.  Use supportive care otherwise.  Maintain allergy medications. Deferred imaging given clear cardiopulmonary exam, hemodynamically stable vital signs.  Counseled patient on potential for adverse effects with medications prescribed/recommended today, ER and return-to-clinic precautions discussed, patient verbalized understanding.    Jaynee Eagles, [place] 12/16/21 1549

## 2022-01-06 ENCOUNTER — Ambulatory Visit (INDEPENDENT_AMBULATORY_CARE_PROVIDER_SITE_OTHER): Payer: BC Managed Care – PPO

## 2022-01-06 ENCOUNTER — Ambulatory Visit
Admission: RE | Admit: 2022-01-06 | Discharge: 2022-01-06 | Disposition: A | Discharge status: Home / Self Care | Payer: BC Managed Care – PPO | Source: Ambulatory Visit | Attending: Emergency Medicine | Admitting: Emergency Medicine

## 2022-01-06 ENCOUNTER — Ambulatory Visit: Payer: Self-pay

## 2022-01-06 VITALS — BP 98/66 | HR 88 | Temp 98.3°F | Resp 16

## 2022-01-06 DIAGNOSIS — R0989 Other specified symptoms and signs involving the circulatory and respiratory systems: Secondary | ICD-10-CM | POA: Diagnosis not present

## 2022-01-06 DIAGNOSIS — J4541 Moderate persistent asthma with (acute) exacerbation: Secondary | ICD-10-CM

## 2022-01-06 DIAGNOSIS — J069 Acute upper respiratory infection, unspecified: Secondary | ICD-10-CM

## 2022-01-06 DIAGNOSIS — J029 Acute pharyngitis, unspecified: Secondary | ICD-10-CM

## 2022-01-06 DIAGNOSIS — R059 Cough, unspecified: Secondary | ICD-10-CM | POA: Diagnosis not present

## 2022-01-06 DIAGNOSIS — J309 Allergic rhinitis, unspecified: Secondary | ICD-10-CM | POA: Diagnosis not present

## 2022-01-06 MED ORDER — TRELEGY ELLIPTA 100-62.5-25 MCG/ACT IN AEPB: 1.0000 | INHALATION_SPRAY | Freq: Every morning | RESPIRATORY_TRACT | 0 refills | Status: AC

## 2022-01-06 MED ORDER — IPRATROPIUM BROMIDE 0.06 % NA SOLN: 2.0000 | Freq: Three times a day (TID) | NASAL | 1 refills | Status: AC

## 2022-01-06 MED ORDER — AZITHROMYCIN 250 MG PO TABS: ORAL_TABLET | ORAL | 0 refills | Status: AC

## 2022-01-06 MED ORDER — FLUTICASONE PROPIONATE 50 MCG/ACT NA SUSP: 1.0000 | Freq: Every day | NASAL | 2 refills | Status: AC

## 2022-01-06 NOTE — ED Triage Notes (Signed)
Pt c/o sore throat, congestion, cough and post nasal drip. The patient states her symptoms have not fully resolved since last visit and have gotten worse over the last two weeks.

## 2022-01-06 NOTE — ED Provider Notes (Signed)
[facility]    CSN: [id number] Arrival date & time: 01/06/22  1433    HISTORY   Chief Complaint  Patient presents with   Sore Throat    Entered by patient   Nasal Congestion   Cough   HPI Sandra Esparza is a pleasant, 31 y.o. female who presents to urgent care today. Pt c/o sore throat, congestion, cough and post nasal drip. The patient states her symptoms have not fully resolved since last visit and have gotten worse over the last two weeks since she was seen here for similar symptoms.  At that visit, patient was provided with a prescription for oral steroids, and a renewal of her albuterol inhaler and advised to take allergy medication.  Per urgent care note, patient was supposed to be prescribed Augmentin but I do not see that the prescription was ever written.  Patient states she did not receive a prescription for Augmentin.  Patient's vital signs are normal on arrival today as they were at her last visit.  Patient appears to be in no acute distress at this time.  Patient states that she did not notice meaningful improvement of breathing after the steroid, states she did have improvement of her sore throat.  Patient states she feels that she has more mucus production now is able to cough up more phlegm than she could before.  The history is provided by the patient.   History reviewed. No pertinent past medical history. There are no problems to display for this patient.  History reviewed. No pertinent surgical history. OB History   No obstetric history on file.    Home Medications    Prior to Admission medications   Medication Sig Start Date End Date Taking? Authorizing Provider  albuterol (VENTOLIN HFA) 108 (90 Base) MCG/ACT inhaler Inhale 1-2 puffs into the lungs every 6 (six) hours as needed for wheezing or shortness of breath. 12/16/21   Jaynee Eagles, PA-C  benzonatate (TESSALON) 100 MG capsule Take 1 capsule (100 mg total) by mouth every 8 (eight) hours. 04/29/17    Tasia Catchings, Amy V, PA-C  cetirizine (ZYRTEC ALLERGY) 10 MG tablet Take 1 tablet (10 mg total) by mouth daily. 12/16/21   Jaynee Eagles, PA-C  ipratropium (ATROVENT) 0.06 % nasal spray Place 2 sprays into both nostrils 4 (four) times daily. 04/29/17   Ok Edwards, PA-C  Multiple Vitamin (MULTIVITAMIN WITH MINERALS) TABS tablet Take 1 tablet by mouth daily.    [provider]  predniSONE (DELTASONE) 20 MG tablet Take 2 tablets daily with breakfast. 12/16/21   Jaynee Eagles, PA-C  promethazine-dextromethorphan (PROMETHAZINE-DM) 6.25-15 MG/5ML syrup Take 2.5 mLs by mouth 3 (three) times daily as needed for cough. 12/16/21   Jaynee Eagles, PA-C  pseudoephedrine (SUDAFED) 60 MG tablet Take 1 tablet (60 mg total) by mouth every 8 (eight) hours as needed for congestion. 12/16/21   Jaynee Eagles, PA-C  Semaglutide-Weight Management (WEGOVY) 2.4 MG/0.75ML SOAJ Inject 2.4 mg into the skin once a week.    [provider]    Family History History reviewed. No pertinent family history. Social History Social History   Tobacco Use   Smoking status: Never   Smokeless tobacco: Never  Vaping Use   Vaping Use: Never used  Substance Use Topics   Alcohol use: Yes    Comment: occasionally   Drug use: No   Allergies   Ortho tri-cyclen [norgestimate-eth estradiol] and Latex  Review of Systems Review of Systems Pertinent findings revealed after performing a 14  point review of systems has been noted in the history of present illness.  Physical Exam Triage Vital Signs ED Triage Vitals  Enc Vitals Group     BP 01/27/21 0827 (!) 147/82     Pulse Rate 01/27/21 0827 72     Resp 01/27/21 0827 18     Temp 01/27/21 0827 98.3 F (36.8 C)     Temp Source 01/27/21 0827 Oral     SpO2 01/27/21 0827 98 %     Weight --      Height --      Head Circumference --      Peak Flow --      Pain Score 01/27/21 0826 5     Pain Loc --      Pain Edu? --      Excl. in GC? --   No data found.  Updated Vital Signs BP  98/66 (BP Location: Left Arm)   Pulse 88   Temp 98.3 F (36.8 C) (Oral)   Resp 16   LMP 12/20/2021   SpO2 97%   Physical Exam Vitals and nursing note reviewed.  Constitutional:      General: She is not in acute distress.    Appearance: Normal appearance. She is not ill-appearing.  HENT:     Head: Normocephalic and atraumatic.     Salivary Glands: Right salivary gland is not diffusely enlarged or tender. Left salivary gland is not diffusely enlarged or tender.     Right Ear: Hearing, ear canal and external ear normal. No drainage. A middle ear effusion is present. There is no impacted cerumen. Tympanic membrane is bulging. Tympanic membrane is not injected or erythematous.     Left Ear: Hearing, ear canal and external ear normal. No drainage. A middle ear effusion is present. There is no impacted cerumen. Tympanic membrane is bulging. Tympanic membrane is not injected or erythematous.     Ears:     Comments: Bilateral EACs normal, both TMs bulging with murky fluid    Nose: Rhinorrhea present. No nasal deformity, septal deviation, signs of injury, nasal tenderness, mucosal edema or congestion. Rhinorrhea is clear.     Right Nostril: Occlusion present. No foreign body, epistaxis or septal hematoma.     Left Nostril: Occlusion present. No foreign body, epistaxis or septal hematoma.     Right Turbinates: Enlarged, swollen and pale.     Left Turbinates: Enlarged, swollen and pale.     Right Sinus: No maxillary sinus tenderness or frontal sinus tenderness.     Left Sinus: No maxillary sinus tenderness or frontal sinus tenderness.     Mouth/Throat:     Lips: Pink. No lesions.     Mouth: Mucous membranes are moist. No oral lesions.     Tongue: No lesions. Tongue does not deviate from midline.     Palate: No mass and lesions.     Pharynx: Oropharynx is clear. Uvula midline. No pharyngeal swelling, oropharyngeal exudate, posterior oropharyngeal erythema or uvula swelling.     Tonsils: No  tonsillar exudate or tonsillar abscesses. 0 on the right. 0 on the left.     Comments: ++Postnasal drip Eyes:     General: Lids are normal.        Right eye: No discharge.        Left eye: No discharge.     Extraocular Movements: Extraocular movements intact.     Conjunctiva/sclera: Conjunctivae normal.     Right eye: Right conjunctiva is not injected.  Left eye: Left conjunctiva is not injected.  Neck:     Trachea: Trachea and phonation normal.  Cardiovascular:     Rate and Rhythm: Normal rate and regular rhythm.     Pulses: Normal pulses.     Heart sounds: Normal heart sounds. No murmur heard.    No friction rub. No gallop.  Pulmonary:     Effort: Pulmonary effort is normal. No tachypnea, bradypnea, accessory muscle usage, prolonged expiration, respiratory distress or retractions.     Breath sounds: No stridor, decreased air movement or transmitted upper airway sounds. Examination of the right-upper field reveals decreased breath sounds. Examination of the left-upper field reveals decreased breath sounds. Examination of the right-middle field reveals decreased breath sounds. Examination of the left-middle field reveals decreased breath sounds. Examination of the right-lower field reveals decreased breath sounds. Examination of the left-lower field reveals decreased breath sounds. Decreased breath sounds present. No wheezing, rhonchi or rales.  Chest:     Chest wall: No tenderness.  Musculoskeletal:        General: Normal range of motion.     Cervical back: Normal range of motion and neck supple. Normal range of motion.  Lymphadenopathy:     Cervical: No cervical adenopathy.  Skin:    General: Skin is warm and dry.     Findings: No erythema or rash.  Neurological:     General: No focal deficit present.     Mental Status: She is alert and oriented to person, place, and time.  Psychiatric:        Mood and Affect: Mood normal.        Behavior: Behavior normal.     Visual  Acuity Right Eye Distance:   Left Eye Distance:   Bilateral Distance:    Right Eye Near:   Left Eye Near:    Bilateral Near:     [facility]ouse / Diagnostics / Procedures:     Radiology DG Chest 2 View  Result Date: 01/06/2022 CLINICAL DATA:  Cough and congestion.  Sore throat. EXAM: CHEST - 2 VIEW COMPARISON:  04/29/2017 FINDINGS: Normal heart, mediastinum and hila. Clear lungs.  No pleural effusion or pneumothorax. Skeletal structures are unremarkable. IMPRESSION: No active cardiopulmonary disease. Electronically Signed   By: Amie Portland M.D.   On: 01/06/2022 16:02    Procedures Procedures (including critical care time) EKG  Pending results:  Labs Reviewed - No data to display  Medications Ordered in UC: Medications - No data to display  UC Diagnoses / Final Clinical Impressions(s)   I have reviewed the triage vital signs and the nursing notes.  Pertinent labs & imaging results that were available during my care of the patient were reviewed by me and considered in my medical decision making (see chart for details).    Final diagnoses:  Acute upper respiratory infection  Moderate persistent asthma with (acute) exacerbation  Allergic rhinitis, unspecified seasonality, unspecified trigger   This patient never received prescription for antibiotics after her visit with urgent care 2 weeks ago, recommend that she begin azithromycin now.  Continue allergy medications.  Patient provided with a sample of Trelegy for relief of asthma symptoms.  Return precautions advised.   ED Prescriptions     Medication Sig Dispense Auth. Provider   fluticasone (FLONASE) 50 MCG/ACT nasal spray Place 1 spray into both nostrils daily. Begin by using 2 sprays in each nare daily for 3 to 5 days, then decrease to 1 spray in each nare daily. 15.8 mL Lequita Halt,  Jaystin Mcgarvey Scales, PA-C   ipratropium (ATROVENT) 0.06 % nasal spray Place 2 sprays into both nostrils 3 (three) times daily. As needed for nasal  congestion, runny nose 15 mL Theadora Rama Scales, PA-C   Fluticasone-Umeclidin-Vilant (TRELEGY ELLIPTA) 100-62.5-25 MCG/ACT AEPB Inhale 1 puff into the lungs in the morning for 14 days. 1 each Theadora Rama Scales, PA-C   azithromycin (ZITHROMAX) 250 MG tablet Take 2 tablets (500 mg total) by mouth daily for 1 day, THEN 1 tablet (250 mg total) daily for 4 days. 6 tablet Theadora Rama Scales, PA-C      PDMP not reviewed this encounter.  Disposition Upon Discharge:  Condition: stable for discharge home Home: take medications as prescribed; routine discharge instructions as discussed; follow up as advised.  Patient presented with an acute illness with associated systemic symptoms and significant discomfort requiring urgent management. In my opinion, this is a condition that a prudent lay person (someone who possesses an average knowledge of health and medicine) may potentially expect to result in complications if not addressed urgently such as respiratory distress, impairment of bodily function or dysfunction of bodily organs.   Routine symptom specific, illness specific and/or disease specific instructions were discussed with the patient and/or caregiver at length.   As such, the patient has been evaluated and assessed, work-up was performed and treatment was provided in alignment with urgent care protocols and evidence based medicine.  Patient/parent/caregiver has been advised that the patient may require follow up for further testing and treatment if the symptoms continue in spite of treatment, as clinically indicated and appropriate.  If the patient was tested for COVID-19, Influenza and/or RSV, then the patient/parent/guardian was advised to isolate at home pending the results of his/her diagnostic coronavirus test and potentially longer if they're positive. I have also advised pt that if his/her COVID-19 test returns positive, it's recommended to self-isolate for at least 10 days after  symptoms first appeared AND until fever-free for 24 hours without fever reducer AND other symptoms have improved or resolved. Discussed self-isolation recommendations as well as instructions for household member/close contacts as per the [facility] and [place] DHHS, and also gave patient the COVID packet with this information.  Patient/parent/caregiver has been advised to return to the [facility] or PCP in 3-5 days if no better; to PCP or the Emergency Department if new signs and symptoms develop, or if the current signs or symptoms continue to change or worsen for further workup, evaluation and treatment as clinically indicated and appropriate  The patient will follow up with their current PCP if and as advised. If the patient does not currently have a PCP we will assist them in obtaining one.   The patient may need specialty follow up if the symptoms continue, in spite of conservative treatment and management, for further workup, evaluation, consultation and treatment as clinically indicated and appropriate.  Patient/parent/caregiver verbalized understanding and agreement of plan as discussed.  All questions were addressed during visit.  Please see discharge instructions below for further details of plan.  Discharge Instructions:   Discharge Instructions       Please see below for list of medications that I recommend to treat and alleviate your current symptoms:    Z-Pak (azithromycin):  take 2 tablets the first day, then take 1 tablet daily every day thereafter until complete.      Zyrtec (cetirizine): This is an excellent second-generation antihistamine that helps to reduce respiratory inflammatory response to environmental allergens.  In some patients, this medication can  cause daytime sleepiness so I recommend that you take 1 tablet daily at bedtime.     Flonase (fluticasone): This is a steroid nasal spray that you use once daily, 1 spray in each nare.  This medication does not work well if you decide to  use it only used as you feel you need to, it works best used on a daily basis.  After 3 to 5 days of use, you will notice significant reduction of the inflammation and mucus production that is currently being caused by exposure to allergens, whether seasonal or environmental.  The most common side effect of this medication is nosebleeds.  If you experience a nosebleed, please discontinue use for 1 week, then feel free to resume.  I have provided you with a prescription.     Atrovent (ipratropium): This is an excellent nasal decongestant spray I have added to your recommended nasal steroid that will not cause rebound congestion, please instill 2 sprays into each nare with each use.  Because nasal steroids can take several days before they begin to provide full benefit, I recommend that you use this spray in addition to the nasal steroid prescribed for you.  Please use it after you have used your nasal steroid and repeat up to 4 times daily as needed.  I have provided you with a prescription for this medication.      ProAir, Ventolin, Proventil (albuterol): This inhaled medication contains a short acting beta agonist bronchodilator.  This medication works on the smooth muscle that opens and constricts of your airways by relaxing the muscle.  The result of relaxation of the smooth muscle is increased air movement and improved work of breathing.  This is a short acting medication that can be used every 4-6 hours as needed for increased work of breathing, shortness of breath, wheezing and excessive coughing.  I have provided you with a prescription.    Trelegy (fluticasone, vilanterol and umeclidinium):  This inhaled medication contains a corticosteroid and long-acting form of albuterol.  The inhaled steroid and this medication  is not absorbed into the body and will not cause side effects such as increased blood sugar levels, irritability, sleeplessness or weight gain.  Inhaled corticosteroid are sort of like  topical steroid creams but, as you can imagine, it is not practical to attempt to rub a steroid cream inside of your lungs.  The long-acting albuterol works similarly to the short acting albuterol found in your rescue inhaler but provides 24-hour relaxation of the smooth muscles that open and constrict your airways; your short acting rescue inhaler can only provide for a few hours this benefit for a few hours.  The third unique ingredient, umeclidinium, is an antimuscarinic and works similarly to your albuterol, providing long-acting relaxation of the smooth muscles in your airway.  Please feel free to continue using your short acting rescue inhaler as often as needed throughout the day for shortness of breath, wheezing, and cough.   Please follow-up within the next 5-7 days either with your primary care provider or urgent care if your symptoms do not resolve.  If you do not have a primary care provider, we will assist you in finding one.        Thank you for visiting urgent care today.  We appreciate the opportunity to participate in your care.         This office note has been dictated using Teaching laboratory technician.  Unfortunately, this method of dictation can sometimes lead to  typographical or grammatical errors.  I apologize for your inconvenience in advance if this occurs.  Please do not hesitate to reach out to me if clarification is needed.      Theadora Rama Scales, [place] 01/07/22 [phone]

## 2022-01-06 NOTE — Discharge Instructions (Addendum)
Please see below for list of medications that I recommend to treat and alleviate your current symptoms:    Z-Pak (azithromycin):  take 2 tablets the first day, then take 1 tablet daily every day thereafter until complete.      Zyrtec (cetirizine): This is an excellent second-generation antihistamine that helps to reduce respiratory inflammatory response to environmental allergens.  In some patients, this medication can cause daytime sleepiness so I recommend that you take 1 tablet daily at bedtime.     Flonase (fluticasone): This is a steroid nasal spray that you use once daily, 1 spray in each nare.  This medication does not work well if you decide to use it only used as you feel you need to, it works best used on a daily basis.  After 3 to 5 days of use, you will notice significant reduction of the inflammation and mucus production that is currently being caused by exposure to allergens, whether seasonal or environmental.  The most common side effect of this medication is nosebleeds.  If you experience a nosebleed, please discontinue use for 1 week, then feel free to resume.  I have provided you with a prescription.     Atrovent (ipratropium): This is an excellent nasal decongestant spray I have added to your recommended nasal steroid that will not cause rebound congestion, please instill 2 sprays into each nare with each use.  Because nasal steroids can take several days before they begin to provide full benefit, I recommend that you use this spray in addition to the nasal steroid prescribed for you.  Please use it after you have used your nasal steroid and repeat up to 4 times daily as needed.  I have provided you with a prescription for this medication.      ProAir, Ventolin, Proventil (albuterol): This inhaled medication contains a short acting beta agonist bronchodilator.  This medication works on the smooth muscle that opens and constricts of your airways by relaxing the muscle.  The result of  relaxation of the smooth muscle is increased air movement and improved work of breathing.  This is a short acting medication that can be used every 4-6 hours as needed for increased work of breathing, shortness of breath, wheezing and excessive coughing.  I have provided you with a prescription.    Trelegy (fluticasone, vilanterol and umeclidinium):  This inhaled medication contains a corticosteroid and long-acting form of albuterol.  The inhaled steroid and this medication  is not absorbed into the body and will not cause side effects such as increased blood sugar levels, irritability, sleeplessness or weight gain.  Inhaled corticosteroid are sort of like topical steroid creams but, as you can imagine, it is not practical to attempt to rub a steroid cream inside of your lungs.  The long-acting albuterol works similarly to the short acting albuterol found in your rescue inhaler but provides 24-hour relaxation of the smooth muscles that open and constrict your airways; your short acting rescue inhaler can only provide for a few hours this benefit for a few hours.  The third unique ingredient, umeclidinium, is an antimuscarinic and works similarly to your albuterol, providing long-acting relaxation of the smooth muscles in your airway.  Please feel free to continue using your short acting rescue inhaler as often as needed throughout the day for shortness of breath, wheezing, and cough.   Please follow-up within the next 5-7 days either with your primary care provider or urgent care if your symptoms do not resolve.  If  you do not have a primary care provider, we will assist you in finding one.        Thank you for visiting urgent care today.  We appreciate the opportunity to participate in your care.

## 2022-09-27 ENCOUNTER — Ambulatory Visit
Admission: RE | Admit: 2022-09-27 | Discharge: 2022-09-27 | Disposition: A | Discharge status: Home / Self Care | Payer: BC Managed Care – PPO | Source: Ambulatory Visit | Attending: Urgent Care | Admitting: Urgent Care

## 2022-09-27 VITALS — BP 136/68 | HR 89 | Temp 98.0°F | Resp 18

## 2022-09-27 DIAGNOSIS — J4530 Mild persistent asthma, uncomplicated: Secondary | ICD-10-CM | POA: Insufficient documentation

## 2022-09-27 DIAGNOSIS — N23 Unspecified renal colic: Secondary | ICD-10-CM | POA: Insufficient documentation

## 2022-09-27 DIAGNOSIS — R109 Unspecified abdominal pain: Secondary | ICD-10-CM | POA: Diagnosis present

## 2022-09-27 DIAGNOSIS — R0602 Shortness of breath: Secondary | ICD-10-CM | POA: Insufficient documentation

## 2022-09-27 HISTORY — DX: Unspecified asthma, uncomplicated: J45.909

## 2022-09-27 LAB — POCT URINALYSIS DIP (MANUAL ENTRY)
Bilirubin, UA: NEGATIVE
Glucose, UA: NEGATIVE mg/dL
Ketones, POC UA: NEGATIVE mg/dL
Leukocytes, UA: NEGATIVE
Nitrite, UA: NEGATIVE
Protein Ur, POC: NEGATIVE mg/dL
Spec Grav, UA: <=1.005 — AB (ref 1.010–1.025)
Urobilinogen, UA: 0.2 E.U./dL
pH, UA: 6 (ref 5.0–8.0)

## 2022-09-27 LAB — POCT URINE PREGNANCY: Preg Test, Ur: NEGATIVE

## 2022-09-27 MED ORDER — TAMSULOSIN HCL 0.4 MG PO CAPS: 0.4000 mg | ORAL_CAPSULE | Freq: Every day | ORAL | 0 refills | Status: AC

## 2022-09-27 MED ORDER — ALBUTEROL SULFATE HFA 108 (90 BASE) MCG/ACT IN AERS: 1.0000 | INHALATION_SPRAY | Freq: Four times a day (QID) | RESPIRATORY_TRACT | 0 refills | Status: AC | PRN

## 2022-09-27 MED ORDER — PREDNISONE 20 MG PO TABS: ORAL_TABLET | ORAL | 0 refills | Status: AC

## 2022-09-27 MED ORDER — HYDROCODONE-ACETAMINOPHEN 5-325 MG PO TABS: 1.0000 | ORAL_TABLET | Freq: Four times a day (QID) | ORAL | 0 refills | Status: AC | PRN

## 2022-09-27 NOTE — ED Triage Notes (Signed)
Pt c/o left side abd/flank pain x 4 days-c/o SHOB started yesterday with increase in need to use inhaler-NAD-steady gait

## 2022-09-27 NOTE — Discharge Instructions (Addendum)
Start tamsulosin for help passing your kidney stone.  Hydrate with 80 ounces of water every day. Please schedule Tylenol at 500 mg - 650 mg once every 6 hours as needed for aches and pains.  If you still have pain despite taking Tylenol regularly, this is breakthrough pain.  You can use hydrocodone once every 6 hours for this.  Once your pain is better controlled, switch back to just Tylenol.   For your asthma, go ahead and start an oral prednisone course for 5 days.  Use your albuterol inhaler once every 4-6 hours as needed.

## 2022-09-27 NOTE — ED Provider Notes (Signed)
[facility]  Note:  This document was prepared using Conservation officer, historic buildings and may include unintentional dictation errors.  MRN: [id number] DOB: [birth year]  Subjective:   Sandra Esparza is a 32 y.o. female presenting for 4-day history of acute onset left-sided lateral flank pain.  Patient has a history of a kidney stone and wants to make sure that it is not what is happening.  No fever, nausea, vomiting, diarrhea, constipation, hematuria, dysuria, urinary frequency or urgency.  Patient hydrates very well given her history of kidney stones.  Yesterday she is also started to have shortness of breath and some coughing, a tickle in her throat.  Has been using her albuterol inhaler regularly.  No fever, chest pain, sinus symptoms, sick symptoms.  No smoking of any kind including cigarettes, cigars, vaping, marijuana use.    No current facility-administered medications for this encounter.  Current Outpatient Medications:    albuterol (VENTOLIN HFA) 108 (90 Base) MCG/ACT inhaler, Inhale 1-2 puffs into the lungs every 6 (six) hours as needed for wheezing or shortness of breath., Disp: 18 g, Rfl: 0   cetirizine (ZYRTEC ALLERGY) 10 MG tablet, Take 1 tablet (10 mg total) by mouth daily., Disp: 90 tablet, Rfl: 0   fluticasone (FLONASE) 50 MCG/ACT nasal spray, Place 1 spray into both nostrils daily. Begin by using 2 sprays in each nare daily for 3 to 5 days, then decrease to 1 spray in each nare daily., Disp: 15.8 mL, Rfl: 2   ipratropium (ATROVENT) 0.06 % nasal spray, Place 2 sprays into both nostrils 3 (three) times daily. As needed for nasal congestion, runny nose, Disp: 15 mL, Rfl: 1   Multiple Vitamin (MULTIVITAMIN WITH MINERALS) TABS tablet, Take 1 tablet by mouth daily., Disp: , Rfl:    QSYMIA 7.5-46 MG CP24, Take 1 capsule by mouth daily., Disp: , Rfl:    Semaglutide-Weight Management (WEGOVY) 2.4 MG/0.75ML SOAJ, Inject 2.4 mg into the skin once a week., Disp: , Rfl:     Allergies  Allergen Reactions   Ortho Tri-Cyclen [Norgestimate-Eth Estradiol] Swelling   Latex Rash    Past Medical History:  Diagnosis Date   Asthma      History reviewed. No pertinent surgical history.  No family history on file.  Social History   Tobacco Use   Smoking status: Never   Smokeless tobacco: Never  Vaping Use   Vaping Use: Never used  Substance Use Topics   Alcohol use: Yes    Comment: occasionally   Drug use: No    ROS   Objective:   Vitals: BP 136/68 (BP Location: Right Arm)   Pulse 89   Temp 98 F (36.7 C) (Oral)   Resp 18   LMP 09/03/2022   SpO2 100%   Physical Exam Constitutional:      General: She is not in acute distress.    Appearance: Normal appearance. She is well-developed. She is not ill-appearing, toxic-appearing or diaphoretic.  HENT:     Head: Normocephalic and atraumatic.     Nose: Nose normal.     Mouth/Throat:     Mouth: Mucous membranes are moist.     Pharynx: Oropharynx is clear.  Eyes:     General: No scleral icterus.       Right eye: No discharge.        Left eye: No discharge.     Extraocular Movements: Extraocular movements intact.     Conjunctiva/sclera: Conjunctivae normal.  Cardiovascular:  Rate and Rhythm: Normal rate and regular rhythm.     Heart sounds: Normal heart sounds. No murmur heard.    No friction rub. No gallop.  Pulmonary:     Effort: Pulmonary effort is normal. No respiratory distress.     Breath sounds: No stridor. No wheezing, rhonchi or rales.  Chest:     Chest wall: No tenderness.  Abdominal:     General: Bowel sounds are normal. There is no distension.     Palpations: Abdomen is soft. There is no mass.     Tenderness: There is no abdominal tenderness. There is no right CVA tenderness, left CVA tenderness, guarding or rebound.  Skin:    General: Skin is warm and dry.  Neurological:     General: No focal deficit present.     Mental Status: She is alert and oriented to person,  place, and time.  Psychiatric:        Mood and Affect: Mood normal.        Behavior: Behavior normal.        Thought Content: Thought content normal.        Judgment: Judgment normal.     Results for orders placed or performed during the hospital encounter of 09/27/22 (from the past 24 hour(s))  POCT urinalysis dipstick     Status: Abnormal   Collection Time: 09/27/22  7:21 PM  Result Value Ref Range   Color, UA yellow yellow   Clarity, UA clear clear   Glucose, UA negative negative mg/dL   Bilirubin, UA negative negative   Ketones, POC UA negative negative mg/dL   Spec Grav, UA <=0.981 (A) 1.010 - 1.025   Blood, UA trace-intact (A) negative   pH, UA 6.0 5.0 - 8.0   Protein Ur, POC negative negative mg/dL   Urobilinogen, UA 0.2 0.2 or 1.0 E.U./dL   Nitrite, UA Negative Negative   Leukocytes, UA Negative Negative  POCT urine pregnancy     Status: None   Collection Time: 09/27/22  7:21 PM  Result Value Ref Range   Preg Test, Ur Negative Negative    Assessment and Plan :   I have reviewed the PDMP during this encounter.  1. Renal colic on left side   2. Left flank pain   3. Mild persistent asthma, uncomplicated   4. Shortness of breath     Recommended managing her respiratory symptoms for her asthma.  Offered an oral prednisone course, maintain albuterol inhaler, provided a refill.  Deferred imaging given clear cardiopulmonary exam, hemodynamically stable vital signs.  Will also manage for renal colic with aggressive hydration, tamsulosin.  Use Tylenol for pain relief, hydrocodone for breakthrough pain.  Counseled patient on potential for adverse effects with medications prescribed/recommended today, ER and return-to-clinic precautions discussed, patient verbalized understanding.    Wallis Bamberg, [place] 09/27/22 1941

## 2022-09-28 LAB — URINE CULTURE: Culture: <10000 — AB
